# Patient Record
Sex: Female | Born: 2005 | Race: Black or African American | Hispanic: No | Marital: Single | Smoking: Current every day smoker
Health system: Southern US, Community
[De-identification: ages and names within clinical notes are randomized; demographics above are authoritative.]

---
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
## 2024-02-27 ENCOUNTER — Emergency Department (HOSPITAL_BASED_OUTPATIENT_CLINIC_OR_DEPARTMENT_OTHER): Admitting: Radiology

## 2024-02-27 ENCOUNTER — Emergency Department (HOSPITAL_BASED_OUTPATIENT_CLINIC_OR_DEPARTMENT_OTHER): Admission: EM | Admit: 2024-02-27 | Discharge: 2024-02-28 | Disposition: A | Discharge status: Home / Self Care

## 2024-02-27 ENCOUNTER — Encounter (HOSPITAL_BASED_OUTPATIENT_CLINIC_OR_DEPARTMENT_OTHER): Payer: Self-pay

## 2024-02-27 ENCOUNTER — Other Ambulatory Visit: Payer: Self-pay

## 2024-02-27 DIAGNOSIS — Z23 Encounter for immunization: Secondary | ICD-10-CM | POA: Diagnosis not present

## 2024-02-27 DIAGNOSIS — T148XXA Other injury of unspecified body region, initial encounter: Secondary | ICD-10-CM | POA: Diagnosis not present

## 2024-02-27 DIAGNOSIS — Y92410 Unspecified street and highway as the place of occurrence of the external cause: Secondary | ICD-10-CM | POA: Diagnosis not present

## 2024-02-27 DIAGNOSIS — S61411A Laceration without foreign body of right hand, initial encounter: Secondary | ICD-10-CM | POA: Insufficient documentation

## 2024-02-27 DIAGNOSIS — S6991XA Unspecified injury of right wrist, hand and finger(s), initial encounter: Secondary | ICD-10-CM | POA: Diagnosis present

## 2024-02-27 DIAGNOSIS — T07XXXA Unspecified multiple injuries, initial encounter: Secondary | ICD-10-CM

## 2024-02-27 MED ORDER — TETANUS-DIPHTH-ACELL PERTUSSIS 5-2-15.5 LF-MCG/0.5 IM SUSP
0.5000 mL | Freq: Once | INTRAMUSCULAR | Status: AC
  Administered 2024-02-27: 0.5 mL via INTRAMUSCULAR
  Filled 2024-02-27: qty 0.5

## 2024-02-27 MED ORDER — NAPROXEN 500 MG PO TABS: 500.0000 mg | ORAL_TABLET | Freq: Two times a day (BID) | ORAL | 0 refills | Status: AC

## 2024-02-27 MED ORDER — METHOCARBAMOL 500 MG PO TABS: 500.0000 mg | ORAL_TABLET | Freq: Two times a day (BID) | ORAL | 0 refills | Status: AC

## 2024-02-27 MED ORDER — HYDROCODONE-ACETAMINOPHEN 5-325 MG PO TABS
1.0000 | ORAL_TABLET | Freq: Once | ORAL | Status: AC
  Administered 2024-02-27: 1 via ORAL
  Filled 2024-02-27: qty 1

## 2024-02-27 MED ORDER — BACITRACIN ZINC 500 UNIT/GM EX OINT
TOPICAL_OINTMENT | Freq: Two times a day (BID) | CUTANEOUS | Status: DC
  Administered 2024-02-27: 1 via TOPICAL

## 2024-02-27 NOTE — Discharge Instructions (Signed)
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
